# Patient Record
Sex: Male | Born: 1980 | Race: White | Hispanic: No | Marital: Married | Smoking: Never smoker
Health system: Southern US, Community
[De-identification: ages and names within clinical notes are randomized; demographics above are authoritative.]

## PROBLEM LIST (undated history)

## (undated) DIAGNOSIS — F419 Anxiety disorder, unspecified: Secondary | ICD-10-CM

## (undated) DIAGNOSIS — I10 Essential (primary) hypertension: Secondary | ICD-10-CM

## (undated) DIAGNOSIS — T7840XA Allergy, unspecified, initial encounter: Secondary | ICD-10-CM

## (undated) HISTORY — DX: Allergy, unspecified, initial encounter: T78.40XA

## (undated) HISTORY — PX: HYDROCELE EXCISION / REPAIR: SUR1145

## (undated) HISTORY — DX: Anxiety disorder, unspecified: F41.9

## (undated) HISTORY — PX: HERNIA REPAIR: SHX51

## (undated) HISTORY — DX: Essential (primary) hypertension: I10

---

## 2003-03-29 ENCOUNTER — Emergency Department (HOSPITAL_COMMUNITY): Admission: EM | Admit: 2003-03-29 | Discharge: 2003-03-29 | Payer: Self-pay | Admitting: Emergency Medicine

## 2005-01-06 ENCOUNTER — Ambulatory Visit: Payer: Self-pay | Admitting: Internal Medicine

## 2005-05-27 ENCOUNTER — Ambulatory Visit: Payer: Self-pay | Admitting: Internal Medicine

## 2006-02-09 ENCOUNTER — Ambulatory Visit: Payer: Self-pay | Admitting: Family Medicine

## 2007-07-11 ENCOUNTER — Emergency Department (HOSPITAL_COMMUNITY): Admission: EM | Admit: 2007-07-11 | Discharge: 2007-07-11 | Payer: Self-pay | Admitting: Family Medicine

## 2009-01-15 IMAGING — CR DG CHEST 1V
1 series · 1 of 1 positions shown · non-contrast
Comparison: NONE

CLINICAL DATA: Pre-employment. 

PA CHEST

[view not recorded]
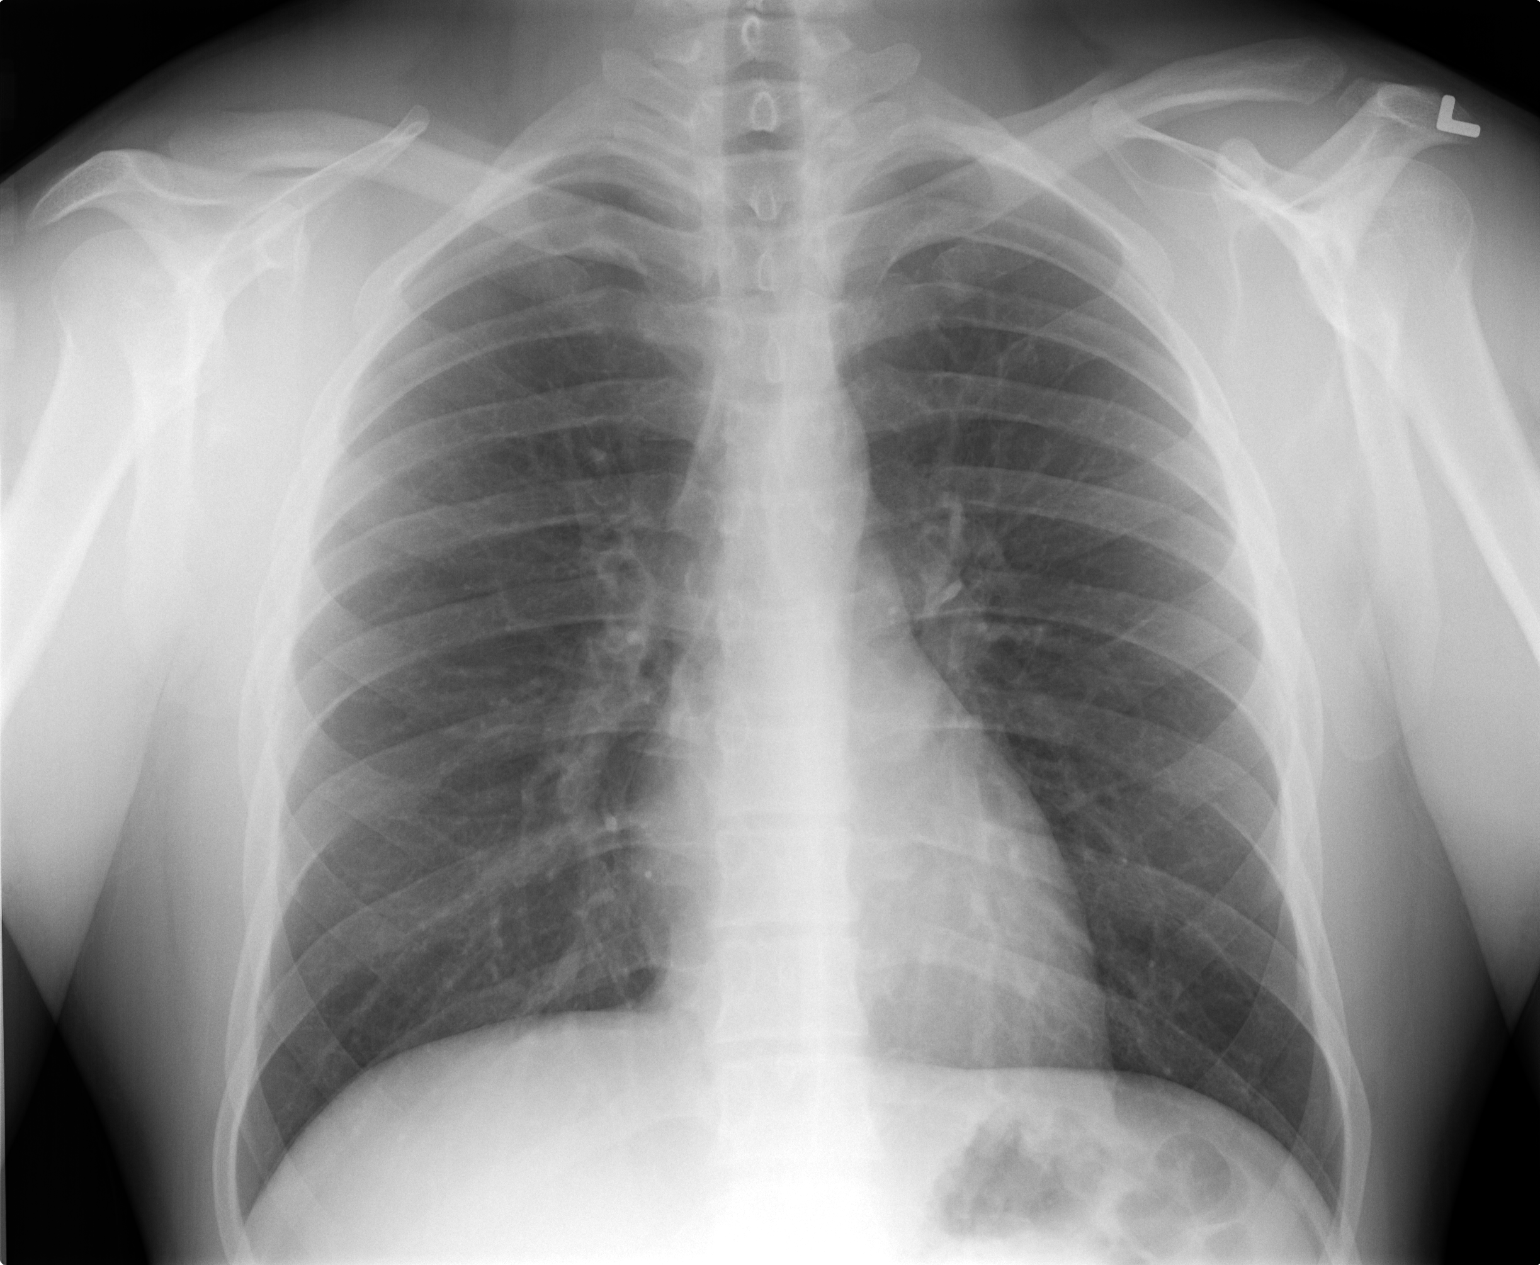

[1 of 1 positions shown; findings below may reference images not displayed]

FINDINGS: The lungs are clear and well expanded.   Heart and 
pulmonary vessels are normal.  No significant abnormalities are 
noted in the regional skeleton. 

electronically reviewed on 04/21/2007 Dict Date: 04/21/2007  Tran 
Date: 04/21/2007 DAS  JEC

## 2009-10-08 ENCOUNTER — Ambulatory Visit (HOSPITAL_BASED_OUTPATIENT_CLINIC_OR_DEPARTMENT_OTHER): Admission: RE | Admit: 2009-10-08 | Discharge: 2009-10-08 | Payer: Self-pay | Admitting: Urology

## 2012-12-13 ENCOUNTER — Ambulatory Visit (INDEPENDENT_AMBULATORY_CARE_PROVIDER_SITE_OTHER): Payer: 59 | Admitting: Emergency Medicine

## 2012-12-13 VITALS — BP 132/81 | HR 72 | Temp 97.9°F | Resp 16 | Ht 71.0 in | Wt 214.0 lb

## 2012-12-13 MED ORDER — CLINDAMYCIN HCL 300 MG PO CAPS: 300.0000 mg | ORAL_CAPSULE | Freq: Three times a day (TID) | ORAL | Status: DC

## 2012-12-13 NOTE — Patient Instructions (Addendum)
Cellulitis Cellulitis is an infection of the skin and the tissue beneath it. The infected area is usually red and tender. Cellulitis occurs most often in the arms and lower legs.   CAUSES   Cellulitis is caused by bacteria that enter the skin through cracks or cuts in the skin. The most common types of bacteria that cause cellulitis are Staphylococcus and Streptococcus. SYMPTOMS    Redness and warmth.   Swelling.   Tenderness or pain.   Fever.  DIAGNOSIS  Your caregiver can usually determine what is wrong based on a physical exam. Blood tests may also be done. TREATMENT   Treatment usually involves taking an antibiotic medicine. HOME CARE INSTRUCTIONS    Take your antibiotics as directed. Finish them even if you start to feel better.   Keep the infected arm or leg elevated to reduce swelling.   Apply a warm cloth to the affected area up to 4 times per day to relieve pain.   Only take over-the-counter or prescription medicines for pain, discomfort, or fever as directed by your caregiver.   Keep all follow-up appointments as directed by your caregiver.  SEEK MEDICAL CARE IF:    You notice red streaks coming from the infected area.   Your red area gets larger or turns dark in color.   Your bone or joint underneath the infected area becomes painful after the skin has healed.   Your infection returns in the same area or another area.   You notice a swollen bump in the infected area.   You develop new symptoms.  SEEK IMMEDIATE MEDICAL CARE IF:    You have a fever.   You feel very sleepy.   You develop vomiting or diarrhea.   You have a general ill feeling (malaise) with muscle aches and pains.  MAKE SURE YOU:    Understand these instructions.   Will watch your condition.   Will get help right away if you are not doing well or get worse.  Document Released: 07/15/2005 Document Revised: 04/05/2012 Document Reviewed: 12/21/2011 [employer] Patient Information 2013  [employer].    

## 2012-12-13 NOTE — Progress Notes (Signed)
[facility] [address] [place] [address] [phone]  Date:  12/13/2012   Name:  Alexander Boyd   DOB:  04/22/1981   MRN:  [id number]  PCP:  No primary provider on file.    Chief Complaint: Rash   History of Present Illness:  EARLE TROIANO is a 32 y.o. very pleasant male patient who presents with the following:  Injured his knee and now has cellulitis knee and lower leg.  No fever or chills.  Unknown mechanism of injury.  Not current on tetanus  There is no problem list on file for this patient.   Past Medical History  Diagnosis Date  . Allergy     Past Surgical History  Procedure Laterality Date  . Hernia repair      History  Substance Use Topics  . Smoking status: Never Smoker   . Smokeless tobacco: Current User    Types: Snuff  . Alcohol Use: Not on file    No family history on file.  Allergies  Allergen Reactions  . Sudafed (Pseudoephedrine Hcl) Palpitations    Medication list has been reviewed and updated.  No current outpatient prescriptions on file prior to visit.   No current facility-administered medications on file prior to visit.    Review of Systems:  As per HPI, otherwise negative.    Physical Examination: Filed Vitals:   12/13/12 2011  BP: 132/81  Pulse: 72  Temp: 97.9 F (36.6 C)  Resp: 16   Filed Vitals:   12/13/12 2011  Height: 5\' 11"  (1.803 m)  Weight: 214 lb (97.07 kg)   Body mass index is 29.86 kg/(m^2). Ideal Body Weight: Weight in (lb) to have BMI = 25: 178.9   GEN: WDWN, NAD, Non-toxic, Alert & Oriented x 3 HEENT: Atraumatic, Normocephalic.  Ears and Nose: No external deformity. EXTR: No clubbing/cyanosis/edema NEURO: Normal gait.  PSYCH: Normally interactive. Conversant. Not depressed or anxious appearing.  Calm demeanor.  Right Leg:  Abrasion knee with cellulitis and lymphangitis.    Assessment and Plan: Cellulitis right leg clindamycin  Carmelina Dane, MD

## 2012-12-14 NOTE — Progress Notes (Signed)
Reviewed and agree.

## 2014-05-28 ENCOUNTER — Emergency Department (HOSPITAL_COMMUNITY)
Admission: EM | Admit: 2014-05-28 | Discharge: 2014-05-28 | Disposition: A | Discharge status: Home / Self Care | Payer: 59 | Source: Home / Self Care

## 2014-05-28 ENCOUNTER — Encounter (HOSPITAL_COMMUNITY): Payer: Self-pay | Admitting: Emergency Medicine

## 2014-05-28 DIAGNOSIS — F411 Generalized anxiety disorder: Secondary | ICD-10-CM

## 2014-05-28 DIAGNOSIS — F419 Anxiety disorder, unspecified: Secondary | ICD-10-CM

## 2014-05-28 DIAGNOSIS — R42 Dizziness and giddiness: Secondary | ICD-10-CM

## 2014-05-28 DIAGNOSIS — F439 Reaction to severe stress, unspecified: Secondary | ICD-10-CM

## 2014-05-28 DIAGNOSIS — R071 Chest pain on breathing: Secondary | ICD-10-CM

## 2014-05-28 DIAGNOSIS — R030 Elevated blood-pressure reading, without diagnosis of hypertension: Secondary | ICD-10-CM

## 2014-05-28 DIAGNOSIS — R0789 Other chest pain: Secondary | ICD-10-CM

## 2014-05-28 DIAGNOSIS — X58XXXA Exposure to other specified factors, initial encounter: Secondary | ICD-10-CM

## 2014-05-28 DIAGNOSIS — S29011A Strain of muscle and tendon of front wall of thorax, initial encounter: Secondary | ICD-10-CM

## 2014-05-28 DIAGNOSIS — S2341XA Sprain of ribs, initial encounter: Secondary | ICD-10-CM

## 2014-05-28 NOTE — Discharge Instructions (Signed)
Chest Wall Pain Chest wall pain is pain in or around the bones and muscles of your chest. It may take up to 6 weeks to get better. It may take longer if you must stay physically active in your work and activities.  CAUSES  Chest wall pain may happen on its own. However, it may be caused by:  A viral illness like the flu.  Injury.  Coughing.  Exercise.  Arthritis.  Fibromyalgia.  Shingles. HOME CARE INSTRUCTIONS   Avoid overtiring physical activity. Try not to strain or perform activities that cause pain. This includes any activities using your chest or your abdominal and side muscles, especially if heavy weights are used.  Put ice on the sore area.  Put ice in a plastic bag.  Place a towel between your skin and the bag.  Leave the ice on for 15-20 minutes per hour while awake for the first 2 days.  Only take over-the-counter or prescription medicines for pain, discomfort, or fever as directed by your caregiver. SEEK IMMEDIATE MEDICAL CARE IF:   Your pain increases, or you are very uncomfortable.  You have a fever.  Your chest pain becomes worse.  You have new, unexplained symptoms.  You have nausea or vomiting.  You feel sweaty or lightheaded.  You have a cough with phlegm (sputum), or you cough up blood. MAKE SURE YOU:   Understand these instructions.  Will watch your condition.  Will get help right away if you are not doing well or get worse. Document Released: 10/05/2005 Document Revised: 12/28/2011 Document Reviewed: 06/01/2011 [facility] Patient Information 2015 [place] [address], [place]. This information is not intended to replace advice given to you by your health care provider. Make sure you discuss any questions you have with your health care provider.  Dizziness Dizziness is a common problem. It is a feeling of unsteadiness or light-headedness. You may feel like you are about to faint. Dizziness can lead to injury if you stumble or fall. A person of any age  group can suffer from dizziness, but dizziness is more common in older adults. CAUSES  Dizziness can be caused by many different things, including:  Middle ear problems.  Standing for too long.  Infections.  An allergic reaction.  Aging.  An emotional response to something, such as the sight of blood.  Side effects of medicines.  Tiredness.  Problems with circulation or blood pressure.  Excessive use of alcohol or medicines, or illegal drug use.  Breathing too fast (hyperventilation).  An irregular heart rhythm (arrhythmia).  A low red blood cell count (anemia).  Pregnancy.  Vomiting, diarrhea, fever, or other illnesses that cause body fluid loss (dehydration).  Diseases or conditions such as Parkinson's disease, high blood pressure (hypertension), diabetes, and thyroid problems.  Exposure to extreme heat. DIAGNOSIS  Your health care provider will ask about your symptoms, perform a physical exam, and perform an electrocardiogram (ECG) to record the electrical activity of your heart. Your health care provider may also perform other heart or blood tests to determine the cause of your dizziness. These may include:  Transthoracic echocardiogram (TTE). During echocardiography, sound waves are used to evaluate how blood flows through your heart.  Transesophageal echocardiogram (TEE).  Cardiac monitoring. This allows your health care provider to monitor your heart rate and rhythm in real time.  Holter monitor. This is a portable device that records your heartbeat and can help diagnose heart arrhythmias. It allows your health care provider to track your heart activity for several days  if needed.  Stress tests by exercise or by giving medicine that makes the heart beat faster. TREATMENT  Treatment of dizziness depends on the cause of your symptoms and can vary greatly. HOME CARE INSTRUCTIONS   Drink enough fluids to keep your urine clear or pale yellow. This is especially  important in very hot weather. In older adults, it is also important in cold weather.  Take your medicine exactly as directed if your dizziness is caused by medicines. When taking blood pressure medicines, it is especially important to get up slowly.  Rise slowly from chairs and steady yourself until you feel okay.  In the morning, first sit up on the side of the bed. When you feel okay, stand slowly while holding onto something until you know your balance is fine.  Move your legs often if you need to stand in one place for a long time. Tighten and relax your muscles in your legs while standing.  Have someone stay with you for 1-2 days if dizziness continues to be a problem. Do this until you feel you are well enough to stay alone. Have the person call your health care provider if he or she notices changes in you that are concerning.  Do not drive or use heavy machinery if you feel dizzy.  Do not drink alcohol. SEEK IMMEDIATE MEDICAL CARE IF:   Your dizziness or light-headedness gets worse.  You feel nauseous or vomit.  You have problems talking, walking, or using your arms, hands, or legs.  You feel weak.  You are not thinking clearly or you have trouble forming sentences. It may take a friend or family member to notice this.  You have chest pain, abdominal pain, shortness of breath, or sweating.  Your vision changes.  You notice any bleeding.  You have side effects from medicine that seems to be getting worse rather than better. MAKE SURE YOU:   Understand these instructions.  Will watch your condition.  Will get help right away if you are not doing well or get worse. Document Released: 03/31/2001 Document Revised: 10/10/2013 Document Reviewed: 04/24/2011 [facility] Patient Information 2015 [place], [place]. This information is not intended to replace advice given to you by your health care provider. Make sure you discuss any questions you have with your health care  provider.  Hypertension Hypertension is another name for high blood pressure. High blood pressure forces your heart to work harder to pump blood. A blood pressure reading has two numbers, which includes a higher number over a lower number (example: 110/72). HOME CARE   Have your blood pressure rechecked by your doctor.  Only take medicine as told by your doctor. Follow the directions carefully. The medicine does not work as well if you skip doses. Skipping doses also puts you at risk for problems.  Do not smoke.  Monitor your blood pressure at home as told by your doctor. GET HELP IF:  You think you are having a reaction to the medicine you are taking.  You have repeat headaches or feel dizzy.  You have puffiness (swelling) in your ankles.  You have trouble with your vision. GET HELP RIGHT AWAY IF:   You get a very bad headache and are confused.  You feel weak, numb, or faint.  You get chest or belly (abdominal) pain.  You throw up (vomit).  You cannot breathe very well. MAKE SURE YOU:   Understand these instructions.  Will watch your condition.  Will get help right away if  you are not doing well or get worse. Document Released: 03/23/2008 Document Revised: 10/10/2013 Document Reviewed: 07/28/2013 [facility] Patient Information 2015 [place], [place]. This information is not intended to replace advice given to you by your health care provider. Make sure you discuss any questions you have with your health care provider.  Stress Stress-related medical problems are becoming increasingly common. The body has a built-in physical response to stressful situations. Faced with pressure, challenge or danger, we need to react quickly. Our bodies release hormones such as cortisol and adrenaline to help do this. These hormones are part of the "fight or flight" response and affect the metabolic rate, heart rate and blood pressure, resulting in a heightened, stressed state that prepares the  body for optimum performance in dealing with a stressful situation. It is likely that early man required these mechanisms to stay alive, but usually modern stresses do not call for this, and the same hormones released in today's world can damage health and reduce coping ability. CAUSES  Pressure to perform at work, at school or in sports.  Threats of physical violence.  Money worries.  Arguments.  Family conflicts.  Divorce or separation from significant other.  Bereavement.  New job or unemployment.  Changes in location.  Alcohol or drug abuse. SOMETIMES, THERE IS NO PARTICULAR REASON FOR DEVELOPING STRESS. Almost all people are at risk of being stressed at some time in their lives. It is important to know that some stress is temporary and some is long term.  Temporary stress will go away when a situation is resolved. Most people can cope with short periods of stress, and it can often be relieved by relaxing, taking a walk or getting any type of exercise, chatting through issues with friends, or having a good night's sleep.  Chronic (long-term, continuous) stress is much harder to deal with. It can be psychologically and emotionally damaging. It can be harmful both for an individual and for friends and family. SYMPTOMS Everyone reacts to stress differently. There are some common effects that help [place] recognize it. In times of extreme stress, people may:  Shake uncontrollably.  Breathe faster and deeper than normal (hyperventilate).  Vomit.  For people with asthma, stress can trigger an attack.  For some people, stress may trigger migraine headaches, ulcers, and body pain. PHYSICAL EFFECTS OF STRESS MAY INCLUDE:  Loss of energy.  Skin problems.  Aches and pains resulting from tense muscles, including neck ache, backache and tension headaches.  Increased pain from arthritis and other conditions.  Irregular heart beat (palpitations).  Periods of irritability or  anger.  Apathy or depression.  Anxiety (feeling uptight or worrying).  Unusual behavior.  Loss of appetite.  Comfort eating.  Lack of concentration.  Loss of, or decreased, sex-drive.  Increased smoking, drinking, or recreational drug use.  For women, missed periods.  Ulcers, joint pain, and muscle pain. Post-traumatic stress is the stress caused by any serious accident, strong emotional damage, or extremely difficult or violent experience such as rape or war. Post-traumatic stress victims can experience mixtures of emotions such as fear, shame, depression, guilt or anger. It may include recurrent memories or images that may be haunting. These feelings can last for weeks, months or even years after the traumatic event that triggered them. Specialized treatment, possibly with medicines and psychological therapies, is available. If stress is causing physical symptoms, severe distress or making it difficult for you to function as normal, it is worth seeing your caregiver. It is important to  remember that although stress is a usual part of life, extreme or prolonged stress can lead to other illnesses that will need treatment. It is better to visit a doctor sooner rather than later. Stress has been linked to the development of high blood pressure and heart disease, as well as insomnia and depression. There is no diagnostic test for stress since everyone reacts to it differently. But a caregiver will be able to spot the physical symptoms, such as:  Headaches.  Shingles.  Ulcers. Emotional distress such as intense worry, low mood or irritability should be detected when the doctor asks pertinent questions to identify any underlying problems that might be the cause. In case there are physical reasons for the symptoms, the doctor may also want to do some tests to exclude certain conditions. If you feel that you are suffering from stress, try to identify the aspects of your life that are causing  it. Sometimes you may not be able to change or avoid them, but even a small change can have a positive ripple effect. A simple lifestyle change can make all the difference. STRATEGIES THAT CAN HELP DEAL WITH STRESS:  Delegating or sharing responsibilities.  Avoiding confrontations.  Learning to be more assertive.  Regular exercise.  Avoid using alcohol or street drugs to cope.  Eating a healthy, balanced diet, rich in fruit and vegetables and proteins.  Finding humor or absurdity in stressful situations.  Never taking on more than you know you can handle comfortably.  Organizing your time better to get as much done as possible.  Talking to friends or family and sharing your thoughts and fears.  Listening to music or relaxation tapes.  Relaxation techniques like deep breathing, meditation, and yoga.  Tensing and then relaxing your muscles, starting at the toes and working up to the head and neck. If you think that you would benefit from help, either in identifying the things that are causing your stress or in learning techniques to help you relax, see a caregiver who is capable of helping you with this. Rather than relying on medications, it is usually better to try and identify the things in your life that are causing stress and try to deal with them. There are many techniques of managing stress including counseling, psychotherapy, aromatherapy, yoga, and exercise. Your caregiver can help you determine what is best for you. Document Released: 12/26/2002 Document Revised: 10/10/2013 Document Reviewed: 11/22/2007  [facility] Patient Information 2015 [place], [place]. This information is not intended to replace advice given to you by your health care provider. Make sure you discuss any questions you have with your health care provider.

## 2014-05-28 NOTE — ED Provider Notes (Signed)
Medical screening examination/treatment/procedure(s) were performed by a resident physician or non-physician practitioner and as the supervising physician I was immediately available for consultation/collaboration.  Linna Darner, MD Family Medicine   Waldemar Dickens, MD 05/28/14 [phone]

## 2014-05-28 NOTE — ED Provider Notes (Signed)
CSN: [id number]     Arrival date & time 05/28/14  0802 History   None    Chief Complaint  Patient presents with  . 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 He also is complaining of left chest tightness which occurred after picking up a table. He also experienced pain in the right low back which she determine was a pulled muscle. He states he can feel when his blood pressure elevates. He does not have a diagnosis of hypertension. There is some discomfort in the left chest when he is performing specific movements. He admits that he spent under a great deal of stress in the past week it has changed his daily routine and decreased his sleep at nighttime. His wife was recently hospitalized for 5 days due to severe bleeding and that has caused increase in stress and anxiety related symptoms. There is no history of cardiopulmonary disease. He is usually active, energetic and works hard without having symptoms. He has not had exertional symptoms.   Past Medical History  Diagnosis Date  . Allergy    Past Surgical History  Procedure Laterality Date  . Hernia repair     History reviewed. No pertinent family history. History  Substance Use Topics  . Smoking status: Never Smoker   . Smokeless tobacco: Current User    Types: Snuff  . Alcohol Use: Not on file    Review of Systems  Constitutional: Positive for activity change and fatigue. Negative for fever.  HENT: Negative.   Eyes: Negative.   Respiratory: Negative for cough, choking, shortness of breath and wheezing.   Cardiovascular: Positive for chest pain and palpitations. Negative for leg swelling.  Gastrointestinal: Negative.   Genitourinary: Negative.   Musculoskeletal: Positive for back pain.  Skin: Negative.   Neurological:  Positive for dizziness. Negative for tremors, syncope, facial asymmetry, speech difficulty, weakness, light-headedness, numbness and headaches.       No problems with vision, speech, hearing, swallowing, focal paresthesias or weakness.  Psychiatric/Behavioral: Positive for sleep disturbance. The patient is nervous/anxious.     Allergies  Sulfa antibiotics and Sudafed  Home Medications   Prior to Admission medications   Not on File   BP 161/109  Pulse 62  Temp(Src) 97.8 F (36.6 C) (Oral)  Resp 16  SpO2 100% Physical Exam  Nursing note and vitals reviewed. Constitutional: He is oriented to person, place, and time. He appears well-developed and well-nourished. No distress.  HENT:  Head: Normocephalic and atraumatic.  Eyes: Conjunctivae and EOM are normal. Pupils are equal, round, and reactive to light.  Neck: Normal range of motion. Neck supple.  Cardiovascular: Normal rate, regular rhythm, normal heart sounds and intact distal pulses.   No murmur heard. Pulmonary/Chest: Effort normal and breath sounds normal. No respiratory distress. He has no wheezes. He has no rales. He exhibits tenderness.  Reproducible tenderness to the left anterior chest wall. Patient states this reproduces the tenderness and pain for which he is complaining.  Musculoskeletal: He exhibits no edema and no tenderness.  Lymphadenopathy:    He has no cervical adenopathy.  Neurological: He is alert and oriented to person, place, and time. No cranial nerve deficit. He exhibits normal muscle tone.  Skin: Skin is warm.  Psychiatric: He has a normal mood and affect.    ED Course  Procedures (including critical care time) Labs Review Labs Reviewed -  No data to display  Imaging Review No results found.   MDM   1. Elevated blood pressure reading without diagnosis of hypertension   2. Dizziness   3. Chest wall pain   4. Intercostal muscle strain, initial encounter   5. Stress at home   6. Anxiety     Reassurance. EKG: Normal sinus rhythm, no ectopy, no ischemic changes. Orthostatic vital signs are normal. Blood pressures are within normal limits. Patient discussed the fact that recently his wife has been hospitalized for bleeding and almost. He has been worrying about that. He also has been working outside and has had some transient dizziness and left chest pain which is shown to be chest wall pain. He denies neurologic symptoms and his neurologic exam is normal. There are multiple factors that are contributing to these symptoms but is doubtful that there is a cardiac or neurologic etiology. He is given red flags in symptoms that he should pay attention to handwritten emergency Department for evaluation. He is stable at this time and is being discharged home.        Janne Napoleon, NP 05/28/14 [phone]

## 2014-05-28 NOTE — ED Notes (Signed)
C/o has not felt right since he got up yesterday, has had a "tightness" in chest, and has felt dizzy . 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 W/d/color good, NAD

## 2014-06-30 ENCOUNTER — Encounter: Payer: Self-pay | Admitting: *Deleted

## 2014-07-24 ENCOUNTER — Ambulatory Visit (INDEPENDENT_AMBULATORY_CARE_PROVIDER_SITE_OTHER): Payer: 59 | Admitting: Family Medicine

## 2014-07-24 ENCOUNTER — Encounter: Payer: Self-pay | Admitting: Family Medicine

## 2014-07-24 VITALS — BP 108/76 | HR 60 | Temp 98.1°F | Resp 12 | Ht 71.5 in | Wt 212.0 lb

## 2014-07-24 DIAGNOSIS — R1013 Epigastric pain: Secondary | ICD-10-CM

## 2014-07-24 LAB — CBC WITH DIFFERENTIAL/PLATELET
BASOS ABS: 0.1 10*3/uL (ref 0.0–0.1)
BASOS PCT: 1 % (ref 0–1)
EOS ABS: 0.3 10*3/uL (ref 0.0–0.7)
Eosinophils Relative: 5 % (ref 0–5)
HCT: 50.7 % (ref 39.0–52.0)
HEMOGLOBIN: 17.9 g/dL — AB (ref 13.0–17.0)
Lymphocytes Relative: 23 % (ref 12–46)
Lymphs Abs: 1.3 10*3/uL (ref 0.7–4.0)
MCH: 29.8 pg (ref 26.0–34.0)
MCHC: 35.3 g/dL (ref 30.0–36.0)
MCV: 84.5 fL (ref 78.0–100.0)
MONOS PCT: 9 % (ref 3–12)
Monocytes Absolute: 0.5 10*3/uL (ref 0.1–1.0)
NEUTROS ABS: 3.5 10*3/uL (ref 1.7–7.7)
NEUTROS PCT: 62 % (ref 43–77)
PLATELETS: 218 10*3/uL (ref 150–400)
RBC: 6 MIL/uL — ABNORMAL HIGH (ref 4.22–5.81)
RDW: 12.9 % (ref 11.5–15.5)
WBC: 5.7 10*3/uL (ref 4.0–10.5)

## 2014-07-24 LAB — COMPLETE METABOLIC PANEL WITH GFR
ALK PHOS: 48 U/L (ref 39–117)
ALT: 42 U/L (ref 0–53)
AST: 24 U/L (ref 0–37)
Albumin: 4.7 g/dL (ref 3.5–5.2)
BILIRUBIN TOTAL: 0.6 mg/dL (ref 0.2–1.2)
BUN: 13 mg/dL (ref 6–23)
CO2: 26 mEq/L (ref 19–32)
Calcium: 9.4 mg/dL (ref 8.4–10.5)
Chloride: 101 mEq/L (ref 96–112)
Creat: 1.12 mg/dL (ref 0.50–1.35)
GFR, EST NON AFRICAN AMERICAN: 87 mL/min
GFR, Est African American: >89 mL/min
GLUCOSE: 93 mg/dL (ref 70–99)
POTASSIUM: 4.5 meq/L (ref 3.5–5.3)
SODIUM: 136 meq/L (ref 135–145)
TOTAL PROTEIN: 7.3 g/dL (ref 6.0–8.3)

## 2014-07-24 NOTE — Progress Notes (Signed)
Subjective:    Patient ID: Alexander Boyd, male    DOB: 1980/11/19, 33 y.o.   MRN: [id number]  HPI 2 months ago, the patient had an uncomfortable sensation that developed in his chest along with an unusual sensation in his skin. This was accompanied with shortness of breath and severe anxiety. Patient believes he may have been having a panic attack. He checked his blood pressure was extremely elevated. The following day he went to urgent medical care where an EKG was obtained. I reviewed the EKG findings myself. Patient's EKG shows normal sinus rhythm with no evidence of ischemia or infarction. The patient does have a repolarization but otherwise there are no abnormalities. The patient was diagnosed with chest wall strain and anxiety. His blood pressure today is much improved at 108/76. He had no further episodes of anxiety. Conversely continues to have chest pain almost on a daily basis. He states at times it can be severe. It is located in the left upper quadrant of his abdomen as well as in his left chest. It is unrelated to exertion. There is no associated dyspnea or hemoptysis or cough. He denies any abdominal pain or melena. He has no tenderness to palpation near his liver or his gallbladder. There is no association of pain with stress. He has had no further anxiety. He does admit he has been having more frequent acid reflux. 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 Allergy   . Anxiety   . Hypertension    Past Surgical History  Procedure Laterality Date  . Hernia repair    . Hydrocele excision / repair     No current outpatient prescriptions on file prior to visit.   No current facility-administered medications on file prior to visit.   Allergies  Allergen Reactions  . Sulfa Antibiotics   . Sudafed [Pseudoephedrine Hcl] Palpitations   History   Social History  . Marital Status: Married    Spouse Name: N/A    Number of Children: N/A    . Years of Education: N/A   Occupational History  . Not on file.   Social History Main Topics  . Smoking status: Never Smoker   . Smokeless tobacco: Current User    Types: Snuff  . Alcohol Use: Yes     Comment: socially  . Drug Use: No  . Sexual Activity: Yes   Other Topics Concern  . Not on file   Social History Narrative  . No narrative on file   Family History  Problem Relation Age of Onset  . COPD Father   . Hearing loss Father   . Hypertension Father       Review of Systems  All other systems reviewed and are negative.      Objective:   Physical Exam  Vitals reviewed. Constitutional: He appears well-developed and well-nourished.  HENT:  Mouth/Throat: Oropharynx is clear and moist.  Eyes: Conjunctivae are normal. No scleral icterus.  Neck: Neck supple. No JVD present. No thyromegaly present.  Cardiovascular: Normal rate, regular rhythm and normal heart sounds.   No murmur heard. Pulmonary/Chest: Effort normal and breath sounds normal. No respiratory distress. He has no wheezes. He has no rales.  Abdominal: Soft. Bowel sounds are normal. He exhibits no distension. There is no tenderness. There is no rebound and no guarding.  Musculoskeletal: Normal range of motion. He exhibits no edema and no tenderness.  Skin: No rash noted. No erythema.  I am unable to reproduce the patient's chest pain with palpation through resisted muscle exercises of the pecs, biceps, triceps, or obliques.       Assessment & Plan:  Epigastric pain - Plan: CBC with Differential, COMPLETE METABOLIC PANEL WITH GFR, Helicobacter pylori abs-IgG+IgA, bld   I believe the patient's chest pain is more likely related to gastroesophageal reflux. Started patient on dexilant 60 mg poqday for 2 weeks.  If pain persists, I would undergo a more extensive workup including possible EGD, chest x-ray. I do not believe the patient's chest pain is cardiac in nature. I did patient was having a panic  attack 2 months ago. However the patient states that this occurs infrequently possibly only once per year. At the present time he does not require medicines to control his anxiety. If the patient's chest/abdominal pain improves after 2 weeks of PPI therapy, I would stop the medication. If the pain immediately returns I recommend to 3 months of continuous therapy. If the pain returns after that, I recommend an EGD.  Also check a CBC, CMP, and a thyroid screen. The patient to return fasting at any time for a fasting lipid panel.  We also discussed strategies to help achieve abstinence from smokeless tobacco.

## 2014-07-26 LAB — HELICOBACTER PYLORI ABS-IGG+IGA, BLD
H PYLORI IGG: 0.57 {ISR}
HELICOBACTER PYLORI AB, IGA: 6.4 U/mL (ref <9.0)

## 2014-07-30 ENCOUNTER — Other Ambulatory Visit: Payer: Self-pay | Admitting: Family Medicine

## 2014-07-30 DIAGNOSIS — D582 Other hemoglobinopathies: Secondary | ICD-10-CM

## 2014-07-31 ENCOUNTER — Other Ambulatory Visit: Payer: 59

## 2014-07-31 DIAGNOSIS — D582 Other hemoglobinopathies: Secondary | ICD-10-CM

## 2014-07-31 LAB — HEMOGLOBIN: HEMOGLOBIN: 17.1 g/dL — AB (ref 13.0–17.0)

## 2018-09-14 DIAGNOSIS — K219 Gastro-esophageal reflux disease without esophagitis: Secondary | ICD-10-CM | POA: Diagnosis not present

## 2018-09-14 DIAGNOSIS — R0683 Snoring: Secondary | ICD-10-CM | POA: Diagnosis not present

## 2018-09-14 DIAGNOSIS — Z1389 Encounter for screening for other disorder: Secondary | ICD-10-CM | POA: Diagnosis not present

## 2018-09-14 DIAGNOSIS — J3089 Other allergic rhinitis: Secondary | ICD-10-CM | POA: Diagnosis not present

## 2018-10-15 ENCOUNTER — Other Ambulatory Visit: Payer: Self-pay

## 2018-10-15 ENCOUNTER — Emergency Department (HOSPITAL_COMMUNITY): Payer: 59

## 2018-10-15 ENCOUNTER — Encounter (HOSPITAL_COMMUNITY): Payer: Self-pay | Admitting: Emergency Medicine

## 2018-10-15 ENCOUNTER — Emergency Department (HOSPITAL_COMMUNITY)
Admission: EM | Admit: 2018-10-15 | Discharge: 2018-10-15 | Disposition: A | Discharge status: Home / Self Care | Payer: 59 | Attending: Emergency Medicine | Admitting: Emergency Medicine

## 2018-10-15 DIAGNOSIS — I10 Essential (primary) hypertension: Secondary | ICD-10-CM | POA: Insufficient documentation

## 2018-10-15 DIAGNOSIS — J181 Lobar pneumonia, unspecified organism: Secondary | ICD-10-CM | POA: Insufficient documentation

## 2018-10-15 DIAGNOSIS — J189 Pneumonia, unspecified organism: Secondary | ICD-10-CM | POA: Diagnosis not present

## 2018-10-15 DIAGNOSIS — R05 Cough: Secondary | ICD-10-CM | POA: Diagnosis not present

## 2018-10-15 DIAGNOSIS — R1013 Epigastric pain: Secondary | ICD-10-CM | POA: Diagnosis not present

## 2018-10-15 DIAGNOSIS — R1031 Right lower quadrant pain: Secondary | ICD-10-CM | POA: Diagnosis not present

## 2018-10-15 LAB — CBC WITH DIFFERENTIAL/PLATELET
ABS IMMATURE GRANULOCYTES: 0.01 10*3/uL (ref 0.00–0.07)
BASOS PCT: 1 %
Basophils Absolute: 0 10*3/uL (ref 0.0–0.1)
EOS ABS: 0.2 10*3/uL (ref 0.0–0.5)
EOS PCT: 3 %
HCT: 46.4 % (ref 39.0–52.0)
Hemoglobin: 15.5 g/dL (ref 13.0–17.0)
Immature Granulocytes: 0 %
Lymphocytes Relative: 18 %
Lymphs Abs: 1 10*3/uL (ref 0.7–4.0)
MCH: 28.2 pg (ref 26.0–34.0)
MCHC: 33.4 g/dL (ref 30.0–36.0)
MCV: 84.4 fL (ref 80.0–100.0)
MONO ABS: 0.7 10*3/uL (ref 0.1–1.0)
MONOS PCT: 13 %
NEUTROS ABS: 3.4 10*3/uL (ref 1.7–7.7)
Neutrophils Relative %: 65 %
PLATELETS: 182 10*3/uL (ref 150–400)
RBC: 5.5 MIL/uL (ref 4.22–5.81)
RDW: 10.7 % — ABNORMAL LOW (ref 11.5–15.5)
WBC: 5.3 10*3/uL (ref 4.0–10.5)
nRBC: 0 % (ref 0.0–0.2)

## 2018-10-15 LAB — COMPREHENSIVE METABOLIC PANEL
ALT: 36 U/L (ref 0–44)
AST: 25 U/L (ref 15–41)
Albumin: 4 g/dL (ref 3.5–5.0)
Alkaline Phosphatase: 43 U/L (ref 38–126)
Anion gap: 8 (ref 5–15)
BUN: 13 mg/dL (ref 6–20)
CALCIUM: 9 mg/dL (ref 8.9–10.3)
CHLORIDE: 106 mmol/L (ref 98–111)
CO2: 25 mmol/L (ref 22–32)
Creatinine, Ser: 1.2 mg/dL (ref 0.61–1.24)
Glucose, Bld: 104 mg/dL — ABNORMAL HIGH (ref 70–99)
Potassium: 3.9 mmol/L (ref 3.5–5.1)
Sodium: 139 mmol/L (ref 135–145)
Total Bilirubin: 0.8 mg/dL (ref 0.3–1.2)
Total Protein: 6.7 g/dL (ref 6.5–8.1)

## 2018-10-15 LAB — URINALYSIS, ROUTINE W REFLEX MICROSCOPIC
BILIRUBIN URINE: NEGATIVE
Glucose, UA: NEGATIVE mg/dL
HGB URINE DIPSTICK: NEGATIVE
KETONES UR: NEGATIVE mg/dL
Leukocytes, UA: NEGATIVE
Nitrite: NEGATIVE
PROTEIN: NEGATIVE mg/dL
Specific Gravity, Urine: 1.026 (ref 1.005–1.030)
pH: 5 (ref 5.0–8.0)

## 2018-10-15 LAB — LIPASE, BLOOD: LIPASE: 34 U/L (ref 11–51)

## 2018-10-15 MED ORDER — ONDANSETRON HCL 4 MG PO TABS: 4.0000 mg | ORAL_TABLET | Freq: Four times a day (QID) | ORAL | 0 refills | Status: DC

## 2018-10-15 MED ORDER — ONDANSETRON 4 MG PO TBDP
4.0000 mg | ORAL_TABLET | Freq: Once | ORAL | Status: AC
  Administered 2018-10-15: 4 mg via ORAL
  Filled 2018-10-15: qty 1

## 2018-10-15 MED ORDER — ONDANSETRON HCL 4 MG PO TABS: 4.0000 mg | ORAL_TABLET | Freq: Four times a day (QID) | ORAL | 0 refills | Status: AC

## 2018-10-15 MED ORDER — SODIUM CHLORIDE 0.9 % IV BOLUS
1000.0000 mL | Freq: Once | INTRAVENOUS | Status: AC
  Administered 2018-10-15: 1000 mL via INTRAVENOUS

## 2018-10-15 MED ORDER — DOXYCYCLINE HYCLATE 100 MG PO CAPS: 100.0000 mg | ORAL_CAPSULE | Freq: Two times a day (BID) | ORAL | 0 refills | Status: AC

## 2018-10-15 MED ORDER — DOXYCYCLINE HYCLATE 100 MG PO TABS
100.0000 mg | ORAL_TABLET | Freq: Once | ORAL | Status: AC
  Administered 2018-10-15: 100 mg via ORAL
  Filled 2018-10-15: qty 1

## 2018-10-15 NOTE — ED Notes (Signed)
Pt to xray at this time.

## 2018-10-15 NOTE — ED Provider Notes (Signed)
[facility] EMERGENCY DEPARTMENT Provider Note   CSN: [id number] Arrival date & time: 10/15/18  1709     History   Chief Complaint Chief Complaint  Patient presents with  . Abdominal Pain  . Fever  . Cough    HPI Alexander Boyd is a 37 y.o. male.  The history is provided by the patient.  Abdominal Pain   This is a new problem. Episode onset: 4 days ago. The problem occurs constantly. Progression since onset: waxing and waning. The pain is associated with an unknown factor. The quality of the pain is cramping and colicky. The pain is at a severity of 5/10. The pain is moderate. Associated symptoms include anorexia, fever, nausea and constipation. Pertinent negatives include diarrhea, vomiting, dysuria and frequency. Associated symptoms comments: Bloating and nausea after eating.  Fever, malaise for the last 4-5 days.  Myalgias.  No congestion, rhinorrhea or ear pain.  Cough only starting today and mild shortness of breath for a short time today when driving back to town from [place]. The symptoms are aggravated by eating. Nothing relieves the symptoms. His past medical history does not include PUD, gallstones or Crohn's disease.  Fever   Associated symptoms include cough. Pertinent negatives include no diarrhea and no vomiting.  Cough     Past Medical History:  Diagnosis Date  . Allergy   . Anxiety   . Anxiety   . Hypertension     There are no active problems to display for this patient.   Past Surgical History:  Procedure Laterality Date  . HERNIA REPAIR    . HYDROCELE EXCISION / REPAIR          Home Medications    Prior to Admission medications   Not on File    Family History Family History  Problem Relation Age of Onset  . COPD Father   . Hearing loss Father   . Hypertension Father     Social History Social History   Tobacco Use  . Smoking status: Never Smoker  . Smokeless tobacco: Current User    Types: Snuff  Substance  Use Topics  . Alcohol use: Yes    Comment: socially  . Drug use: No     Allergies   Sulfa antibiotics and Sudafed [pseudoephedrine hcl]   Review of Systems Review of Systems  Constitutional: Positive for fever.  Respiratory: Positive for cough.   Gastrointestinal: Positive for abdominal pain, anorexia, constipation and nausea. Negative for diarrhea and vomiting.  Genitourinary: Negative for dysuria and frequency.  All other systems reviewed and are negative.    Physical Exam Updated Vital Signs BP (!) 139/91 (BP Location: Right Arm)   Pulse 86   Temp 99 F (37.2 C) (Oral)   Resp 16   Ht 6' (1.829 m)   Wt 95.3 kg   SpO2 99%   BMI 28.48 kg/m   Physical Exam Vitals signs and nursing note reviewed.  Constitutional:      General: He is not in acute distress.    Appearance: He is well-developed.  HENT:     Head: Normocephalic and atraumatic.     Right Ear: Tympanic membrane and external ear normal.     Left Ear: Tympanic membrane and external ear normal.     Nose: Nose normal.     Mouth/Throat:     Mouth: Mucous membranes are dry.     Pharynx: No oropharyngeal exudate or posterior oropharyngeal erythema.  Eyes:     General:  Right eye: No discharge.     Conjunctiva/sclera: Conjunctivae normal.     Pupils: Pupils are equal, round, and reactive to light.  Neck:     Musculoskeletal: Normal range of motion and neck supple.  Cardiovascular:     Rate and Rhythm: Normal rate and regular rhythm.     Heart sounds: Normal heart sounds. No murmur.  Pulmonary:     Effort: Pulmonary effort is normal. Tachypnea present. No respiratory distress.     Breath sounds: Examination of the left-lower field reveals rhonchi. Rhonchi present. No decreased breath sounds, wheezing or rales.  Abdominal:     General: There is no distension.     Palpations: Abdomen is soft. There is no mass.     Tenderness: There is abdominal tenderness in the right lower quadrant and epigastric  area. There is no right CVA tenderness, guarding or rebound.     Hernia: No hernia is present.  Musculoskeletal: Normal range of motion.        General: No tenderness.  Skin:    General: Skin is warm and dry.     Findings: No erythema or rash.  Neurological:     Mental Status: He is alert and oriented to person, place, and time.  Psychiatric:        Behavior: Behavior normal.      ED Treatments / Results  Labs (all labs ordered are listed, but only abnormal results are displayed) Labs Reviewed  CBC WITH DIFFERENTIAL/PLATELET - Abnormal; Notable for the following components:      Result Value   RDW 10.7 (*)    All other components within normal limits  COMPREHENSIVE METABOLIC PANEL - Abnormal; Notable for the following components:   Glucose, Bld 104 (*)    All other components within normal limits  LIPASE, BLOOD  URINALYSIS, ROUTINE W REFLEX MICROSCOPIC    EKG None  Radiology Dg Chest 2 View  Result Date: 10/15/2018 CLINICAL DATA:  Pt c/o flu symptoms onset 3 days ago. Pt C/o abd pain with nausea, productive cough, Elevated temp and constipation. Pt states this am he had an episode of SOB but it resided. EXAM: CHEST - 2 VIEW COMPARISON:  04/20/2007 FINDINGS: The heart size is normal. There is patchy infiltrate in the MEDIAL LEFT LOWER lobe, consistent with infectious infiltrate. No pulmonary edema. IMPRESSION: MEDIAL LEFT LOWER lobe infiltrate. Electronically Signed   By: Nolon Nations M.D.   On: 10/15/2018 18:48    Procedures Procedures (including critical care time)  Medications Ordered in ED Medications  sodium chloride 0.9 % bolus 1,000 mL (has no administration in time range)     Initial Impression / Assessment and Plan / ED Course  I have reviewed the triage vital signs and the nursing notes.  Pertinent labs & imaging results that were available during my care of the patient were reviewed by me and considered in my medical decision making (see chart for  details).     Pt presents with fever, abd pain, general malaise.  Patient has had 4 to 5 days of illness.  Symptoms are worse with eating.  On exam he has some minimal right lower quadrant tenderness but no rebound or guarding.  He has had fever which has not resolved.  He also has felt constipated.  He denies any urinary symptoms.  He did develop a cough today and has some rhonchi in the left lower lobe but no prior URI symptoms and symptoms are not classic for flu.  He was  vaccinated this year.  Concern for possible hepatitis versus gallbladder disease versus viral vs PNA.  Patient does have some right lower quadrant pain but lower suspicion for appendicitis.  IV fluids given.  CBC, CMP, lipase, UA pending.  7:15 PM Labs are wnl.  CXR with medial LLL PNA which explain the cough, rhonchi on exam and mild shortness of breath earlier.  Patient is in no acute distress and satting 99% on room air.  Patient will be covered with doxycycline and follow-up with his PCP if not improving within the next few days.  Final Clinical Impressions(s) / ED Diagnoses   Final diagnoses:  Community acquired pneumonia of left lower lobe of lung [facility])    ED Discharge Orders         Ordered    doxycycline (VIBRAMYCIN) 100 MG capsule  2 times daily     10/15/18 1916           Blanchie Dessert, MD 10/15/18 [phone]

## 2018-10-15 NOTE — ED Triage Notes (Signed)
Pt c/o flu symptoms onset 3 days ago.  Pt  C/o abd pain with nausea, productive cough,  Elevated temp and constipation

## 2018-10-18 DIAGNOSIS — R21 Rash and other nonspecific skin eruption: Secondary | ICD-10-CM | POA: Diagnosis not present

## 2018-10-18 DIAGNOSIS — J189 Pneumonia, unspecified organism: Secondary | ICD-10-CM | POA: Diagnosis not present

## 2018-10-18 DIAGNOSIS — R509 Fever, unspecified: Secondary | ICD-10-CM | POA: Diagnosis not present

## 2018-10-20 DIAGNOSIS — R21 Rash and other nonspecific skin eruption: Secondary | ICD-10-CM | POA: Diagnosis not present

## 2018-10-20 DIAGNOSIS — J189 Pneumonia, unspecified organism: Secondary | ICD-10-CM | POA: Diagnosis not present

## 2018-10-20 DIAGNOSIS — T7849XD Other allergy, subsequent encounter: Secondary | ICD-10-CM | POA: Diagnosis not present

## 2018-10-23 ENCOUNTER — Emergency Department (HOSPITAL_COMMUNITY)
Admission: EM | Admit: 2018-10-23 | Discharge: 2018-10-24 | Disposition: A | Discharge status: Home / Self Care | Payer: 59 | Attending: Emergency Medicine | Admitting: Emergency Medicine

## 2018-10-23 ENCOUNTER — Emergency Department (HOSPITAL_COMMUNITY): Payer: 59

## 2018-10-23 DIAGNOSIS — T7840XA Allergy, unspecified, initial encounter: Secondary | ICD-10-CM | POA: Insufficient documentation

## 2018-10-23 DIAGNOSIS — R918 Other nonspecific abnormal finding of lung field: Secondary | ICD-10-CM | POA: Diagnosis not present

## 2018-10-23 DIAGNOSIS — T3695XA Adverse effect of unspecified systemic antibiotic, initial encounter: Secondary | ICD-10-CM | POA: Diagnosis not present

## 2018-10-23 DIAGNOSIS — R21 Rash and other nonspecific skin eruption: Secondary | ICD-10-CM | POA: Diagnosis not present

## 2018-10-23 DIAGNOSIS — L509 Urticaria, unspecified: Secondary | ICD-10-CM | POA: Diagnosis not present

## 2018-10-23 DIAGNOSIS — I10 Essential (primary) hypertension: Secondary | ICD-10-CM | POA: Insufficient documentation

## 2018-10-23 LAB — CBC WITH DIFFERENTIAL/PLATELET
Abs Immature Granulocytes: 0.39 10*3/uL — ABNORMAL HIGH (ref 0.00–0.07)
BASOS ABS: 0.1 10*3/uL (ref 0.0–0.1)
Basophils Relative: 0 %
EOS PCT: 1 %
Eosinophils Absolute: 0.1 10*3/uL (ref 0.0–0.5)
HEMATOCRIT: 47.3 % (ref 39.0–52.0)
HEMOGLOBIN: 16.9 g/dL (ref 13.0–17.0)
IMMATURE GRANULOCYTES: 3 %
LYMPHS ABS: 2.2 10*3/uL (ref 0.7–4.0)
LYMPHS PCT: 15 %
MCH: 30.8 pg (ref 26.0–34.0)
MCHC: 35.7 g/dL (ref 30.0–36.0)
MCV: 86.3 fL (ref 80.0–100.0)
Monocytes Absolute: 1.4 10*3/uL — ABNORMAL HIGH (ref 0.1–1.0)
Monocytes Relative: 9 %
NRBC: 0.1 % (ref 0.0–0.2)
Neutro Abs: 11.3 10*3/uL — ABNORMAL HIGH (ref 1.7–7.7)
Neutrophils Relative %: 72 %
Platelets: 370 10*3/uL (ref 150–400)
RBC: 5.48 MIL/uL (ref 4.22–5.81)
RDW: 11.1 % — ABNORMAL LOW (ref 11.5–15.5)
WBC: 15.4 10*3/uL — ABNORMAL HIGH (ref 4.0–10.5)

## 2018-10-23 LAB — BASIC METABOLIC PANEL
ANION GAP: 9 (ref 5–15)
BUN: 21 mg/dL — AB (ref 6–20)
CO2: 25 mmol/L (ref 22–32)
CREATININE: 1.15 mg/dL (ref 0.61–1.24)
Calcium: 9.1 mg/dL (ref 8.9–10.3)
Chloride: 103 mmol/L (ref 98–111)
GFR calc Af Amer: >60 mL/min (ref >60)
GFR calc non Af Amer: >60 mL/min (ref >60)
GLUCOSE: 114 mg/dL — AB (ref 70–99)
Potassium: 3.7 mmol/L (ref 3.5–5.1)
Sodium: 137 mmol/L (ref 135–145)

## 2018-10-23 MED ORDER — EPINEPHRINE 0.3 MG/0.3ML IJ SOAJ
0.3000 mg | Freq: Once | INTRAMUSCULAR | Status: AC
  Administered 2018-10-24: 0.3 mg via INTRAMUSCULAR
  Filled 2018-10-23: qty 0.3

## 2018-10-23 MED ORDER — METHYLPREDNISOLONE SODIUM SUCC 125 MG IJ SOLR
125.0000 mg | Freq: Once | INTRAMUSCULAR | Status: AC
  Administered 2018-10-24: 125 mg via INTRAVENOUS
  Filled 2018-10-23: qty 2

## 2018-10-23 MED ORDER — DIPHENHYDRAMINE HCL 50 MG/ML IJ SOLN
25.0000 mg | Freq: Once | INTRAMUSCULAR | Status: AC
  Administered 2018-10-24: 25 mg via INTRAVENOUS
  Filled 2018-10-23: qty 1

## 2018-10-23 MED ORDER — FAMOTIDINE IN NACL 20-0.9 MG/50ML-% IV SOLN
20.0000 mg | Freq: Once | INTRAVENOUS | Status: AC
  Administered 2018-10-24: 20 mg via INTRAVENOUS
  Filled 2018-10-23: qty 50

## 2018-10-23 NOTE — ED Triage Notes (Signed)
Pt reports being dx with PNA 10/15/18. Was started on Doxy, concern for allergic rxn to that and was switched to Levaquin. Pt presents with rash to entire body, states throat itching. Pt speaking in full clear sentences, VS WNL.

## 2018-10-23 NOTE — ED Provider Notes (Signed)
[place] EMERGENCY DEPARTMENT Provider Note   CSN: [id number] Arrival date & time: 10/23/18  2256     History   Chief Complaint Chief Complaint  Patient presents with  . Allergic Reaction    HPI Alexander Boyd is a 38 y.o. male.  Patient is a 38 year old male with history of hypertension.  He presents today for evaluation of allergic reaction.  He was diagnosed 1 week ago with you.  He was initially started on doxycycline, then changed to Levaquin due to possible allergic reaction.  He developed itching and hives several days after starting the doxycycline.  He has been on Levaquin for the past 4 days.  This evening his rash and itching returned.  He describes red rash to his torso, face, and extremities.  He also describes some tingling of his mouth and lips.  He denies any difficulty breathing or swallowing.  He denies any other new contacts or exposures.  The history is provided by the patient.  Allergic Reaction  Presenting symptoms: itching and rash   Presenting symptoms: no difficulty breathing and no difficulty swallowing   Severity:  Moderate Duration:  2 hours Context: medications   Relieved by:  Nothing Worsened by:  Nothing   Past Medical History:  Diagnosis Date  . Allergy   . Anxiety   . Anxiety   . Hypertension     There are no active problems to display for this patient.   Past Surgical History:  Procedure Laterality Date  . HERNIA REPAIR    . HYDROCELE EXCISION / REPAIR          Home Medications    Prior to Admission medications   Medication Sig Start Date End Date Taking? Authorizing Provider  doxycycline (VIBRAMYCIN) 100 MG capsule Take 1 capsule (100 mg total) by mouth 2 (two) times daily. 10/15/18   Blanchie Dessert, MD  ondansetron (ZOFRAN) 4 MG tablet Take 1 tablet (4 mg total) by mouth every 6 (six) hours. 10/15/18   Domenic Moras, PA-C    Family History Family History  Problem Relation Age of Onset  . COPD Father    . Hearing loss Father   . Hypertension Father     Social History Social History   Tobacco Use  . Smoking status: Never Smoker  . Smokeless tobacco: Current User    Types: Snuff  Substance Use Topics  . Alcohol use: Yes    Comment: socially  . Drug use: No     Allergies   Sulfa antibiotics; Doxycycline; and Sudafed [pseudoephedrine hcl]   Review of Systems Review of Systems  HENT: Negative for trouble swallowing.   Skin: Positive for itching and rash.  All other systems reviewed and are negative.    Physical Exam Updated Vital Signs BP (!) 146/99 (BP Location: Left Arm)   Pulse 74   Temp (!) 97.4 F (36.3 C) (Oral)   Resp 17   Ht 6' (1.829 m)   Wt 95.3 kg   SpO2 94%   BMI 28.48 kg/m   Physical Exam Vitals signs and nursing note reviewed.  Constitutional:      General: He is not in acute distress.    Appearance: He is well-developed. He is not diaphoretic.  HENT:     Head: Normocephalic and atraumatic.     Mouth/Throat:     Mouth: Mucous membranes are moist.     Pharynx: No posterior oropharyngeal erythema.     Comments: There is no stridor or oral  swelling. Neck:     Musculoskeletal: Normal range of motion and neck supple.  Cardiovascular:     Rate and Rhythm: Normal rate and regular rhythm.     Heart sounds: No murmur. No friction rub.  Pulmonary:     Effort: Pulmonary effort is normal. No respiratory distress.     Breath sounds: Normal breath sounds. No wheezing or rales.  Abdominal:     General: Bowel sounds are normal. There is no distension.     Palpations: Abdomen is soft.     Tenderness: There is no abdominal tenderness.  Musculoskeletal: Normal range of motion.  Skin:    General: Skin is warm and dry.     Findings: Rash present.     Comments: There is an urticarial rash present to the torso, neck, and face.  Neurological:     Mental Status: He is alert and oriented to person, place, and time.     Coordination: Coordination normal.       ED Treatments / Results  Labs (all labs ordered are listed, but only abnormal results are displayed) Labs Reviewed  CBC WITH DIFFERENTIAL/PLATELET - Abnormal; Notable for the following components:      Result Value   WBC 15.4 (*)    RDW 11.1 (*)    Neutro Abs 11.3 (*)    Monocytes Absolute 1.4 (*)    Abs Immature Granulocytes 0.39 (*)    All other components within normal limits  BASIC METABOLIC PANEL    EKG None  Radiology No results found.  Procedures Procedures (including critical care time)  Medications Ordered in ED Medications  diphenhydrAMINE (BENADRYL) injection 25 mg (has no administration in time range)  methylPREDNISolone sodium succinate (SOLU-MEDROL) 125 mg/2 mL injection 125 mg (has no administration in time range)  famotidine (PEPCID) IVPB 20 mg premix (has no administration in time range)  EPINEPHrine (EPI-PEN) injection 0.3 mg (has no administration in time range)     Initial Impression / Assessment and Plan / ED Course  I have reviewed the triage vital signs and the nursing notes.  Pertinent labs & imaging results that were available during my care of the patient were reviewed by me and considered in my medical decision making (see chart for details).  Patient presents here with complaints of rash.  This is been occurring episodically for the past week since being diagnosed with pneumonia.  It was originally felt to be related to doxycycline, however he has stopped this and is now taking Levaquin and these episodes persist.  This evening when he arrived home he developed rash to the face and chest.  He was given epi, steroids, and Benadryl here in the ER and the rash has since resolved.  I doubt this is related to the antibiotics he is taking.  At this point he appears stable and I see no indication for further work-up or admission.  He will be discharged with an EpiPen and is to return as needed if symptoms worsen.  If these episodes continue, he  should follow-up with an allergist to discuss allergy testing.  Final Clinical Impressions(s) / ED Diagnoses   Final diagnoses:  None    ED Discharge Orders    None       Veryl Speak, MD 10/24/18 [phone]

## 2018-10-24 MED ORDER — EPINEPHRINE 0.3 MG/0.3ML IJ SOAJ: 0.3000 mg | Freq: Once | INTRAMUSCULAR | 1 refills | Status: AC

## 2018-10-24 NOTE — Discharge Instructions (Addendum)
Continue prednisone and Levaquin as previously prescribed.  Continue Benadryl 25 mg every 6 hours for the next 2 days.  EpiPen as needed if you develop difficulty breathing, throat swelling, or severely worsening rash.  Follow-up with your primary doctor if symptoms persist to discuss referral to an allergist.

## 2018-10-24 NOTE — ED Notes (Signed)
The pt feels ok

## 2018-10-27 DIAGNOSIS — J45909 Unspecified asthma, uncomplicated: Secondary | ICD-10-CM | POA: Diagnosis not present

## 2018-10-27 DIAGNOSIS — J189 Pneumonia, unspecified organism: Secondary | ICD-10-CM | POA: Diagnosis not present

## 2018-10-27 DIAGNOSIS — T7849XD Other allergy, subsequent encounter: Secondary | ICD-10-CM | POA: Diagnosis not present

## 2018-10-28 DIAGNOSIS — L501 Idiopathic urticaria: Secondary | ICD-10-CM | POA: Diagnosis not present

## 2018-10-28 DIAGNOSIS — T783XXD Angioneurotic edema, subsequent encounter: Secondary | ICD-10-CM | POA: Diagnosis not present

## 2018-10-28 DIAGNOSIS — T783XXA Angioneurotic edema, initial encounter: Secondary | ICD-10-CM | POA: Diagnosis not present

## 2018-10-28 DIAGNOSIS — T781XXD Other adverse food reactions, not elsewhere classified, subsequent encounter: Secondary | ICD-10-CM | POA: Diagnosis not present

## 2018-11-15 DIAGNOSIS — D1801 Hemangioma of skin and subcutaneous tissue: Secondary | ICD-10-CM | POA: Diagnosis not present

## 2018-11-15 DIAGNOSIS — D225 Melanocytic nevi of trunk: Secondary | ICD-10-CM | POA: Diagnosis not present

## 2018-11-15 DIAGNOSIS — D2261 Melanocytic nevi of right upper limb, including shoulder: Secondary | ICD-10-CM | POA: Diagnosis not present

## 2018-12-08 DIAGNOSIS — Z6829 Body mass index (BMI) 29.0-29.9, adult: Secondary | ICD-10-CM | POA: Diagnosis not present

## 2018-12-08 DIAGNOSIS — J45909 Unspecified asthma, uncomplicated: Secondary | ICD-10-CM | POA: Diagnosis not present

## 2018-12-08 DIAGNOSIS — R05 Cough: Secondary | ICD-10-CM | POA: Diagnosis not present

## 2019-02-09 ENCOUNTER — Telehealth: Payer: Self-pay | Admitting: Internal Medicine

## 2019-02-09 NOTE — Telephone Encounter (Signed)
Pt. Called the COVID 19 question line and asked where he could have the antibody testing done for Coronavirus.  Ret'd call to pt.  Stated he was very sick in January, and would like to have the antibody test done to find out if he actually had the Coronavirus.  Advised that at this time, there is no testing being done in the [place].  Encouraged to call his PCP's office @ [facility], and inquire if they are performing the Coronavirus antibody testing.  Pt. Agreed with plan.

## 2020-07-20 IMAGING — DX DG CHEST 2V
2 series · 2 of 2 positions shown · non-contrast
Comparison: Prior radiograph from 10/15/2018.

CLINICAL DATA: Initial evaluation for acute allergic reaction.

EXAM:
CHEST - 2 VIEW

[chest pa]
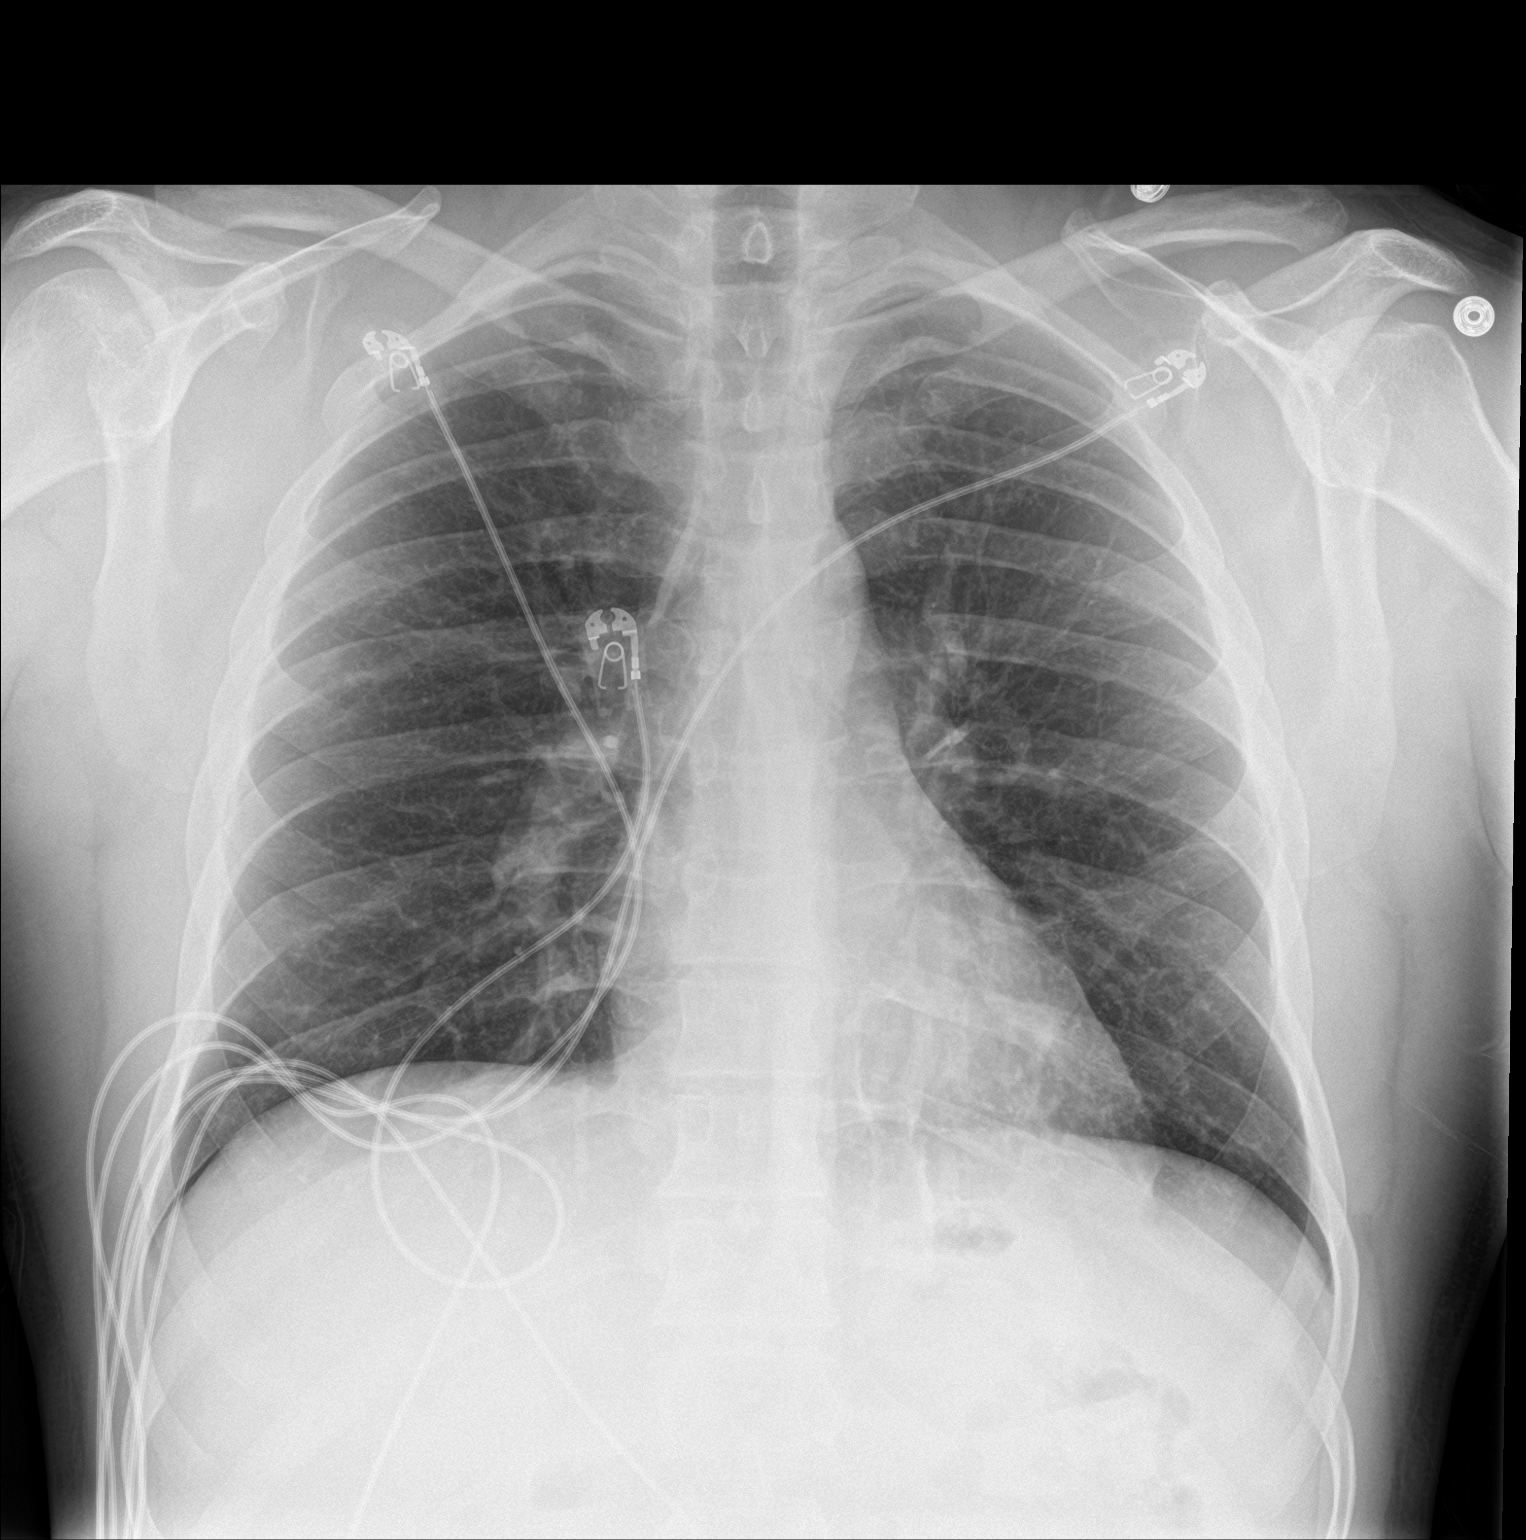

[chest lat]
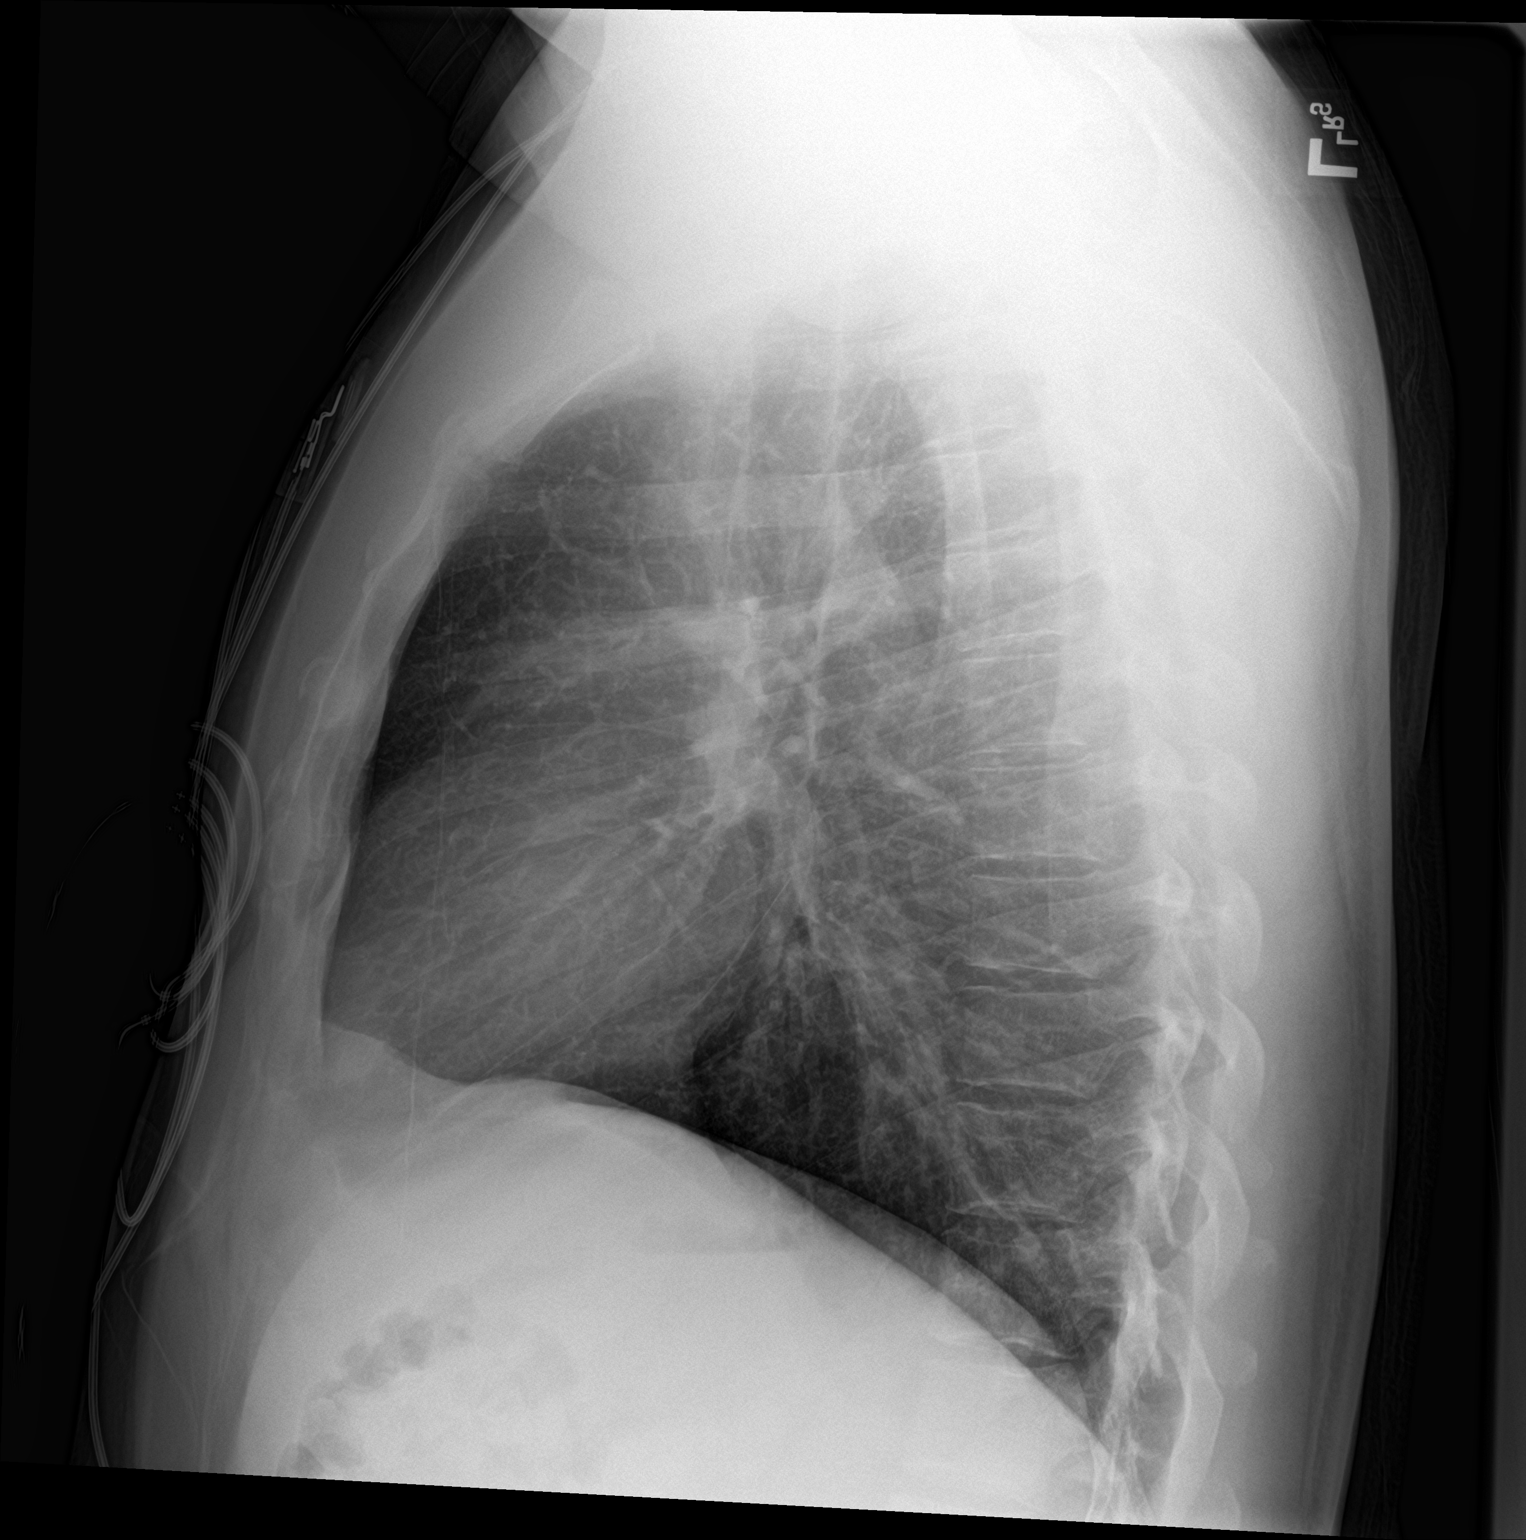

[2 of 2 positions shown; findings below may reference images not displayed]

FINDINGS: Cardiac and mediastinal silhouettes are stable in size and contour,
and remain within normal limits.

Lungs normally inflated. Previously seen left lower lobe infiltrate
is improved, with mild scattered residual streaky densities present
within this region. No new consolidative opacity. No edema or
effusion. No pneumothorax.

Osseous structures within normal limits.
IMPRESSION: 1. Interval improvement in left lower lobe infiltrate, with mild
residual streaky densities now seen within this region.
2. No other new active cardiopulmonary disease.

## 2021-05-15 ENCOUNTER — Other Ambulatory Visit: Payer: Self-pay

## 2021-05-15 MED ORDER — CLOBETASOL PROPIONATE 0.05 % EX LIQD
CUTANEOUS | 3 refills | Status: AC
  Filled 2021-05-15: qty 125, 30d supply, fill #0

## 2021-05-15 MED ORDER — TERBINAFINE HCL 250 MG PO TABS
ORAL_TABLET | ORAL | 0 refills | Status: AC
  Filled 2021-05-15: qty 14, 14d supply, fill #0

## 2021-05-15 MED ORDER — BETAMETHASONE DIPROPIONATE 0.05 % EX CREA
TOPICAL_CREAM | Freq: Two times a day (BID) | CUTANEOUS | 3 refills | Status: AC
  Filled 2021-05-15: qty 45, 30d supply, fill #0

## 2021-05-16 ENCOUNTER — Other Ambulatory Visit: Payer: Self-pay

## 2021-05-19 ENCOUNTER — Other Ambulatory Visit: Payer: Self-pay

## 2022-03-25 DIAGNOSIS — L720 Epidermal cyst: Secondary | ICD-10-CM | POA: Diagnosis not present

## 2022-03-25 DIAGNOSIS — D225 Melanocytic nevi of trunk: Secondary | ICD-10-CM | POA: Diagnosis not present

## 2022-03-25 DIAGNOSIS — L578 Other skin changes due to chronic exposure to nonionizing radiation: Secondary | ICD-10-CM | POA: Diagnosis not present

## 2022-03-25 DIAGNOSIS — D485 Neoplasm of uncertain behavior of skin: Secondary | ICD-10-CM | POA: Diagnosis not present

## 2022-03-25 DIAGNOSIS — L918 Other hypertrophic disorders of the skin: Secondary | ICD-10-CM | POA: Diagnosis not present

## 2022-03-25 DIAGNOSIS — B353 Tinea pedis: Secondary | ICD-10-CM | POA: Diagnosis not present

## 2022-05-25 DIAGNOSIS — E119 Type 2 diabetes mellitus without complications: Secondary | ICD-10-CM | POA: Diagnosis not present

## 2022-05-25 DIAGNOSIS — I10 Essential (primary) hypertension: Secondary | ICD-10-CM | POA: Diagnosis not present

## 2022-05-25 DIAGNOSIS — E559 Vitamin D deficiency, unspecified: Secondary | ICD-10-CM | POA: Diagnosis not present

## 2022-05-25 DIAGNOSIS — D518 Other vitamin B12 deficiency anemias: Secondary | ICD-10-CM | POA: Diagnosis not present

## 2022-05-25 DIAGNOSIS — E782 Mixed hyperlipidemia: Secondary | ICD-10-CM | POA: Diagnosis not present

## 2022-05-25 DIAGNOSIS — E038 Other specified hypothyroidism: Secondary | ICD-10-CM | POA: Diagnosis not present

## 2022-05-25 DIAGNOSIS — Z0000 Encounter for general adult medical examination without abnormal findings: Secondary | ICD-10-CM | POA: Diagnosis not present

## 2023-04-27 DIAGNOSIS — B353 Tinea pedis: Secondary | ICD-10-CM | POA: Diagnosis not present

## 2023-04-27 DIAGNOSIS — Z79899 Other long term (current) drug therapy: Secondary | ICD-10-CM | POA: Diagnosis not present

## 2023-04-27 DIAGNOSIS — B351 Tinea unguium: Secondary | ICD-10-CM | POA: Diagnosis not present

## 2023-04-27 DIAGNOSIS — L821 Other seborrheic keratosis: Secondary | ICD-10-CM | POA: Diagnosis not present

## 2023-05-04 DIAGNOSIS — Z131 Encounter for screening for diabetes mellitus: Secondary | ICD-10-CM | POA: Diagnosis not present

## 2023-05-04 DIAGNOSIS — J069 Acute upper respiratory infection, unspecified: Secondary | ICD-10-CM | POA: Diagnosis not present

## 2023-05-04 DIAGNOSIS — Z13220 Encounter for screening for lipoid disorders: Secondary | ICD-10-CM | POA: Diagnosis not present

## 2023-05-04 DIAGNOSIS — Z1321 Encounter for screening for nutritional disorder: Secondary | ICD-10-CM | POA: Diagnosis not present

## 2023-05-04 DIAGNOSIS — Z1389 Encounter for screening for other disorder: Secondary | ICD-10-CM | POA: Diagnosis not present

## 2023-05-04 DIAGNOSIS — Z0000 Encounter for general adult medical examination without abnormal findings: Secondary | ICD-10-CM | POA: Diagnosis not present

## 2023-05-04 DIAGNOSIS — Z1329 Encounter for screening for other suspected endocrine disorder: Secondary | ICD-10-CM | POA: Diagnosis not present

## 2024-05-23 DIAGNOSIS — Z0000 Encounter for general adult medical examination without abnormal findings: Secondary | ICD-10-CM | POA: Diagnosis not present

## 2024-05-23 DIAGNOSIS — Z1321 Encounter for screening for nutritional disorder: Secondary | ICD-10-CM | POA: Diagnosis not present

## 2024-05-23 DIAGNOSIS — Z1389 Encounter for screening for other disorder: Secondary | ICD-10-CM | POA: Diagnosis not present

## 2024-05-23 DIAGNOSIS — Z1329 Encounter for screening for other suspected endocrine disorder: Secondary | ICD-10-CM | POA: Diagnosis not present

## 2024-05-23 DIAGNOSIS — J069 Acute upper respiratory infection, unspecified: Secondary | ICD-10-CM | POA: Diagnosis not present

## 2024-05-23 DIAGNOSIS — Z131 Encounter for screening for diabetes mellitus: Secondary | ICD-10-CM | POA: Diagnosis not present

## 2024-05-23 DIAGNOSIS — Z13220 Encounter for screening for lipoid disorders: Secondary | ICD-10-CM | POA: Diagnosis not present
